# Patient Record
Sex: Female | Born: 1945 | Race: White | Hispanic: No | Marital: Married | Smoking: Never smoker
Health system: Southern US, Community
[De-identification: ages and names within clinical notes are randomized; demographics above are authoritative.]

## PROBLEM LIST (undated history)

## (undated) DIAGNOSIS — E079 Disorder of thyroid, unspecified: Secondary | ICD-10-CM

## (undated) DIAGNOSIS — I509 Heart failure, unspecified: Secondary | ICD-10-CM

## (undated) DIAGNOSIS — R9409 Abnormal results of other function studies of central nervous system: Secondary | ICD-10-CM

## (undated) DIAGNOSIS — I219 Acute myocardial infarction, unspecified: Secondary | ICD-10-CM

---

## 2012-12-12 ENCOUNTER — Emergency Department (HOSPITAL_COMMUNITY)
Admission: EM | Admit: 2012-12-12 | Discharge: 2012-12-12 | Disposition: A | Discharge status: Home / Self Care | Attending: Emergency Medicine | Admitting: Emergency Medicine

## 2012-12-12 ENCOUNTER — Encounter (HOSPITAL_COMMUNITY): Payer: Self-pay | Admitting: Emergency Medicine

## 2012-12-12 ENCOUNTER — Emergency Department (HOSPITAL_COMMUNITY)

## 2012-12-12 DIAGNOSIS — I252 Old myocardial infarction: Secondary | ICD-10-CM | POA: Insufficient documentation

## 2012-12-12 DIAGNOSIS — Z7982 Long term (current) use of aspirin: Secondary | ICD-10-CM | POA: Insufficient documentation

## 2012-12-12 DIAGNOSIS — Z862 Personal history of diseases of the blood and blood-forming organs and certain disorders involving the immune mechanism: Secondary | ICD-10-CM | POA: Insufficient documentation

## 2012-12-12 DIAGNOSIS — M79609 Pain in unspecified limb: Secondary | ICD-10-CM | POA: Insufficient documentation

## 2012-12-12 DIAGNOSIS — R209 Unspecified disturbances of skin sensation: Secondary | ICD-10-CM | POA: Insufficient documentation

## 2012-12-12 DIAGNOSIS — M79602 Pain in left arm: Secondary | ICD-10-CM

## 2012-12-12 DIAGNOSIS — Z79899 Other long term (current) drug therapy: Secondary | ICD-10-CM | POA: Insufficient documentation

## 2012-12-12 DIAGNOSIS — Z8639 Personal history of other endocrine, nutritional and metabolic disease: Secondary | ICD-10-CM | POA: Insufficient documentation

## 2012-12-12 DIAGNOSIS — I509 Heart failure, unspecified: Secondary | ICD-10-CM | POA: Insufficient documentation

## 2012-12-12 HISTORY — DX: Acute myocardial infarction, unspecified: I21.9

## 2012-12-12 HISTORY — DX: Disorder of thyroid, unspecified: E07.9

## 2012-12-12 HISTORY — DX: Heart failure, unspecified: I50.9

## 2012-12-12 MED ORDER — HYDROCODONE-ACETAMINOPHEN 5-325 MG PO TABS: 1.0000 | ORAL_TABLET | Freq: Once | ORAL | Status: DC

## 2012-12-12 MED ORDER — IBUPROFEN 200 MG PO TABS
600.0000 mg | ORAL_TABLET | Freq: Once | ORAL | Status: AC
  Administered 2012-12-12: 600 mg via ORAL
  Filled 2012-12-12: qty 3

## 2012-12-12 MED ORDER — MELOXICAM 15 MG PO TABS: 15.0000 mg | ORAL_TABLET | Freq: Every day | ORAL | Status: DC

## 2012-12-12 NOTE — ED Notes (Signed)
Pt c/o lt arm pain x several weeks.  States that now her lt ear is numb.  Pt will not get off the phone for her to talk to me.

## 2012-12-12 NOTE — ED Provider Notes (Signed)
CSN: [id number]     Arrival date & time 12/12/12  1208 History     First MD Initiated Contact with Patient 12/12/12 1255     Chief Complaint  Patient presents with  . Arm Pain   (Consider location/radiation/quality/duration/timing/severity/associated sxs/prior Treatment) HPI  Eileen Burke is a 67 y.o.female with a significant PMH of hx of CHF, thyroid disease and MI presents to the ER I her primary care doctor at [facility] who she was supposed to have an appointment with today for 1.5 weeks of left arm pain and onset today of left ear and neck numbness. Patient says that her left arm has been very painful to the touch and with range of motion and was due to see her primary care doctor today but when she called to speak with him before the appointment he told her to come to the ER instead. She said that a couple hours ago she developed this left side a year and facial numbness that has since mostly resolved. She admits that she felt weird. She has not had any aphasia, dysphasia, difficulty with her balance, headache or pain, nausea, vomiting, diarrhea, generalized or focal weakness. She has a history of CHF but has not had any lower extremity swelling, chest pain or wheezing.   Past Medical History  Diagnosis Date  . Thyroid disease   . CHF (congestive heart failure)   . MI (myocardial infarction)    History reviewed. No pertinent past surgical history. History reviewed. No pertinent family history. History  Substance Use Topics  . Smoking status: Never Smoker   . Smokeless tobacco: Not on file  . Alcohol Use: No   OB History   Grav Para Term Preterm Abortions TAB SAB Ect Mult Living                 Review of Systems ROS is negative unless otherwise stated in the HPI  Allergies  Codeine  Home Medications   Current Outpatient Rx  Name  Route  Sig  Dispense  Refill  . aspirin 325 MG tablet   Oral   Take 325 mg by mouth daily.         . calcium carbonate (OS-CAL) 600  MG TABS tablet   Oral   Take 600 mg by mouth 2 (two) times daily with a meal.         . cholecalciferol (VITAMIN D) 1000 UNITS tablet   Oral   Take 1,000 Units by mouth daily.         . naproxen sodium (ANAPROX) 220 MG tablet   Oral   Take 220 mg by mouth 2 (two) times daily with a meal.         . meloxicam (MOBIC) 15 MG tablet   Oral   Take 1 tablet (15 mg total) by mouth daily.   14 tablet   0    BP 108/63  Pulse 81  Temp(Src) 98.2 F (36.8 C) (Oral)  Resp 16  SpO2 98% Physical Exam  Nursing note and vitals reviewed. Constitutional: She is oriented to person, place, and time. She appears well-developed and well-nourished. No distress.  HENT:  Head: Normocephalic and atraumatic.   HEENT: Anicteric.  No pallor.  No discharge from ears, eyes, nose, or mouth.   Eyes: Pupils are equal, round, and reactive to light.  Neck: Normal range of motion. Neck supple.  Cardiovascular: Normal rate and regular rhythm.   Pulmonary/Chest: Effort normal.  Abdominal: Soft.  Musculoskeletal:  Left shoulder: She exhibits decreased range of motion, tenderness, bony tenderness and pain. She exhibits no swelling, no effusion, no crepitus, no deformity, no laceration, no spasm, normal pulse and normal strength.       Arms: Patient's arms are symmetrical no noticeable swelling of the left side compared to the right. Patient's hand has normal color and is warm no decreased range of motion. Capillary refill is less than 3 seconds. Patient can move her left arm although is painful to do so.   Neurological: She is alert and oriented to person, place, and time. She has normal strength. No cranial nerve deficit or sensory deficit. She displays a negative Romberg sign.  Skin: Skin is warm and dry.    ED Course   Procedures (including critical care time)  Labs Reviewed - No data to display Dg Chest 2 View  12/12/2012   *RADIOLOGY REPORT*  Clinical Data: Left shoulder and arm pain  progressively worse over 2 weeks, some numbness in left side of head and neck, history CHF, asthma  CHEST - 2 VIEW  Comparison: None.  Findings: Normal heart size, mediastinal contours, and pulmonary vascularity. Lungs clear. No pleural effusion or pneumothorax. Bones unremarkable.  IMPRESSION: No acute abnormalities.   Original Report Authenticated By: Ulyses Southward, M.D.   Dg Shoulder Left  12/12/2012   *RADIOLOGY REPORT*  Clinical Data: Left shoulder and arm pain progressively worse over 2 weeks  LEFT SHOULDER - 2+ VIEW  Comparison: None  Findings: Osseous mineralization normal. AC joint alignment normal. No acute fracture, dislocation, or bone destruction. Visualized left ribs intact.  IMPRESSION: No acute osseous abnormalities.   Original Report Authenticated By: Ulyses Southward, M.D.   1. Arm pain, left     MDM  Patient's pain appears to be musculoskeletal. No acute abnormalities to left shoulder or chest x-ray. Does not cardiac or pulmonary. They sound nerve related. She declined any muscle relaxers or strong pain medicine for this problem and would only like ibuprofen. Her ear exam did not show any abnormalities and she has a sensation of some tingling on the left side of her face but can appreciate my touch. She does not appear to be in any distress. Discussed case with Dr. Patria Mane before dc.  Pt upset that she was placed in the hallway and informs me that she has insurance and feels that she should have been given a normal bed. She wants to leave now since her xrays are normal. Will give a referral to ortho and ask that she reschedule her appointment with her PCP.  Rx: mobic  67 y.o.Eileen Burke evaluation in the Emergency Department is complete. It has been determined that no acute conditions requiring further emergency intervention are present at this time. The patient/guardian have been advised of the diagnosis and plan. We have discussed signs and symptoms that warrant return to the ED, such  as changes or worsening in symptoms.  Vital signs are stable at discharge. Filed Vitals:   12/12/12 1237  BP: 108/63  Pulse: 81  Temp: 98.2 F (36.8 C)  Resp: 16    Patient/guardian has voiced understanding and agreed to follow-up with the PCP or specialist.   Dorthula Matas, PA-C 12/12/12 1506

## 2012-12-12 NOTE — ED Provider Notes (Signed)
Medical screening examination/treatment/procedure(s) were performed by non-physician practitioner and as supervising physician I was immediately available for consultation/collaboration.  Lluvia Gwynne M Orvella Digiulio, MD 12/12/12 1507 

## 2014-02-14 ENCOUNTER — Emergency Department (HOSPITAL_COMMUNITY): Payer: Medicare Other

## 2014-02-14 ENCOUNTER — Encounter (HOSPITAL_COMMUNITY): Payer: Self-pay | Admitting: Emergency Medicine

## 2014-02-14 ENCOUNTER — Emergency Department (HOSPITAL_COMMUNITY)
Admission: EM | Admit: 2014-02-14 | Discharge: 2014-02-15 | Disposition: A | Discharge status: Home / Self Care | Payer: Medicare Other | Attending: Emergency Medicine | Admitting: Emergency Medicine

## 2014-02-14 DIAGNOSIS — Z79899 Other long term (current) drug therapy: Secondary | ICD-10-CM | POA: Insufficient documentation

## 2014-02-14 DIAGNOSIS — I252 Old myocardial infarction: Secondary | ICD-10-CM | POA: Insufficient documentation

## 2014-02-14 DIAGNOSIS — K8070 Calculus of gallbladder and bile duct without cholecystitis without obstruction: Secondary | ICD-10-CM | POA: Diagnosis not present

## 2014-02-14 DIAGNOSIS — Z8639 Personal history of other endocrine, nutritional and metabolic disease: Secondary | ICD-10-CM | POA: Diagnosis not present

## 2014-02-14 DIAGNOSIS — R1011 Right upper quadrant pain: Secondary | ICD-10-CM

## 2014-02-14 DIAGNOSIS — Z7982 Long term (current) use of aspirin: Secondary | ICD-10-CM | POA: Insufficient documentation

## 2014-02-14 DIAGNOSIS — Z791 Long term (current) use of non-steroidal anti-inflammatories (NSAID): Secondary | ICD-10-CM | POA: Insufficient documentation

## 2014-02-14 DIAGNOSIS — I509 Heart failure, unspecified: Secondary | ICD-10-CM | POA: Insufficient documentation

## 2014-02-14 HISTORY — DX: Abnormal results of other function studies of central nervous system: R94.09

## 2014-02-14 LAB — CBC WITH DIFFERENTIAL/PLATELET
BASOS ABS: 0 10*3/uL (ref 0.0–0.1)
Basophils Relative: 0 % (ref 0–1)
EOS ABS: 0.1 10*3/uL (ref 0.0–0.7)
EOS PCT: 2 % (ref 0–5)
HCT: 39.6 % (ref 36.0–46.0)
Hemoglobin: 13.5 g/dL (ref 12.0–15.0)
Lymphocytes Relative: 38 % (ref 12–46)
Lymphs Abs: 2.7 10*3/uL (ref 0.7–4.0)
MCH: 28.7 pg (ref 26.0–34.0)
MCHC: 34.1 g/dL (ref 30.0–36.0)
MCV: 84.1 fL (ref 78.0–100.0)
Monocytes Absolute: 0.5 10*3/uL (ref 0.1–1.0)
Monocytes Relative: 7 % (ref 3–12)
Neutro Abs: 3.9 10*3/uL (ref 1.7–7.7)
Neutrophils Relative %: 53 % (ref 43–77)
PLATELETS: 292 10*3/uL (ref 150–400)
RBC: 4.71 MIL/uL (ref 3.87–5.11)
RDW: 13.3 % (ref 11.5–15.5)
WBC: 7.3 10*3/uL (ref 4.0–10.5)

## 2014-02-14 LAB — COMPREHENSIVE METABOLIC PANEL
ALT: 15 U/L (ref 0–35)
AST: 22 U/L (ref 0–37)
Albumin: 3.7 g/dL (ref 3.5–5.2)
Alkaline Phosphatase: 81 U/L (ref 39–117)
Anion gap: 13 (ref 5–15)
BILIRUBIN TOTAL: 0.3 mg/dL (ref 0.3–1.2)
BUN: 9 mg/dL (ref 6–23)
CHLORIDE: 102 meq/L (ref 96–112)
CO2: 28 meq/L (ref 19–32)
CREATININE: 0.91 mg/dL (ref 0.50–1.10)
Calcium: 9.2 mg/dL (ref 8.4–10.5)
GFR calc Af Amer: 75 mL/min — ABNORMAL LOW (ref >90)
GFR, EST NON AFRICAN AMERICAN: 64 mL/min — AB (ref >90)
GLUCOSE: 167 mg/dL — AB (ref 70–99)
Potassium: 3.6 mEq/L — ABNORMAL LOW (ref 3.7–5.3)
Sodium: 143 mEq/L (ref 137–147)
Total Protein: 7.3 g/dL (ref 6.0–8.3)

## 2014-02-14 LAB — D-DIMER, QUANTITATIVE: D-Dimer, Quant: 0.5 ug/mL-FEU — ABNORMAL HIGH (ref 0.00–0.48)

## 2014-02-14 LAB — I-STAT TROPONIN, ED: Troponin i, poc: 0 ng/mL (ref 0.00–0.08)

## 2014-02-14 LAB — LIPASE, BLOOD: LIPASE: 26 U/L (ref 11–59)

## 2014-02-14 MED ORDER — SODIUM CHLORIDE 0.9 % IV BOLUS (SEPSIS)
500.0000 mL | Freq: Once | INTRAVENOUS | Status: AC
  Administered 2014-02-14: 500 mL via INTRAVENOUS

## 2014-02-14 MED ORDER — FENTANYL CITRATE 0.05 MG/ML IJ SOLN: 50.0000 ug | Freq: Once | INTRAMUSCULAR | Status: DC

## 2014-02-14 MED ORDER — ONDANSETRON HCL 4 MG/2ML IJ SOLN: 4.0000 mg | Freq: Once | INTRAMUSCULAR | Status: DC

## 2014-02-14 NOTE — ED Notes (Signed)
Pt c/o RUQ pain, worsening today. RUQ tender on palpation. Denies NV, has had episodes of diarrhea. Describes pain as intermittent sharp shooting pain. Denies urinary issues. Pt reports pain increases after eating.

## 2014-02-14 NOTE — ED Notes (Signed)
Presents with ruq abdominal pain began this AM when awoke became worse throughour the day. Pt states she has been unable to eat  For one month. Pt is requesting ice water and moaning in pain.  She states the pain is worse when she bends over.

## 2014-02-15 DIAGNOSIS — K8070 Calculus of gallbladder and bile duct without cholecystitis without obstruction: Secondary | ICD-10-CM | POA: Diagnosis not present

## 2014-02-15 MED ORDER — TRAMADOL HCL 50 MG PO TABS: 50.0000 mg | ORAL_TABLET | Freq: Four times a day (QID) | ORAL | Status: AC | PRN

## 2014-02-15 MED ORDER — HYDROCODONE-ACETAMINOPHEN 5-325 MG PO TABS: 1.0000 | ORAL_TABLET | Freq: Four times a day (QID) | ORAL | Status: AC | PRN

## 2014-02-15 MED ORDER — TRAMADOL HCL 50 MG PO TABS
50.0000 mg | ORAL_TABLET | Freq: Once | ORAL | Status: AC
  Administered 2014-02-15: 50 mg via ORAL
  Filled 2014-02-15: qty 1

## 2014-02-15 NOTE — Discharge Instructions (Signed)
The results here show that you have gall stones. The pain is not typical of gall bladders, but yourp ain might be from gall stones none the less, as you hada negative CT scan recently of the chest. See your GI doctor as planned. See the Surgeons as requested.  Please return to the ER if your symptoms worsen; you have increased pain, fevers, chills, inability to keep any medications down, confusion. Otherwise see the outpatient doctor as requested.   Biliary Colic  Biliary colic is a steady or irregular pain in the upper abdomen. It is usually under the right side of the rib cage. It happens when gallstones interfere with the normal flow of bile from the gallbladder. Bile is a liquid that helps to digest fats. Bile is made in the liver and stored in the gallbladder. When you eat a meal, bile passes from the gallbladder through the cystic duct and the common bile duct into the small intestine. There, it mixes with partially digested food. If a gallstone blocks either of these ducts, the normal flow of bile is blocked. The muscle cells in the bile duct contract forcefully to try to move the stone. This causes the pain of biliary colic.  SYMPTOMS   A person with biliary colic usually complains of pain in the upper abdomen. This pain can be:  In the center of the upper abdomen just below the breastbone.  In the upper-right part of the abdomen, near the gallbladder and liver.  Spread back toward the right shoulder blade.  Nausea and vomiting.  The pain usually occurs after eating.  Biliary colic is usually triggered by the digestive system's demand for bile. The demand for bile is high after fatty meals. Symptoms can also occur when a person who has been fasting suddenly eats a very large meal. Most episodes of biliary colic pass after 1 to 5 hours. After the most intense pain passes, your abdomen may continue to ache mildly for about 24 hours. DIAGNOSIS  After you describe your symptoms, your  caregiver will perform a physical exam. He or she will pay attention to the upper right portion of your belly (abdomen). This is the area of your liver and gallbladder. An ultrasound will help your caregiver look for gallstones. Specialized scans of the gallbladder may also be done. Blood tests may be done, especially if you have fever or if your pain persists. PREVENTION  Biliary colic can be prevented by controlling the risk factors for gallstones. Some of these risk factors, such as heredity, increasing age, and pregnancy are a normal part of life. Obesity and a high-fat diet are risk factors you can change through a healthy lifestyle. Women going through menopause who take hormone replacement therapy (estrogen) are also more likely to develop biliary colic. TREATMENT   Pain medication may be prescribed.  You may be encouraged to eat a fat-free diet.  If the first episode of biliary colic is severe, or episodes of colic keep retuning, surgery to remove the gallbladder (cholecystectomy) is usually recommended. This procedure can be done through small incisions using an instrument called a laparoscope. The procedure often requires a brief stay in the hospital. Some people can leave the hospital the same day. It is the most widely used treatment in people troubled by painful gallstones. It is effective and safe, with no complications in more than 90% of cases.  If surgery cannot be done, medication that dissolves gallstones may be used. This medication is expensive and can take  months or years to work. Only small stones will dissolve.  Rarely, medication to dissolve gallstones is combined with a procedure called shock-wave lithotripsy. This procedure uses carefully aimed shock waves to break up gallstones. In many people treated with this procedure, gallstones form again within a few years. PROGNOSIS  If gallstones block your cystic duct or common bile duct, you are at risk for repeated episodes of  biliary colic. There is also a 25% chance that you will develop a gallbladder infection(acute cholecystitis), or some other complication of gallstones within 10 to 20 years. If you have surgery, schedule it at a time that is convenient for you and at a time when you are not sick. HOME CARE INSTRUCTIONS   Drink plenty of clear fluids.  Avoid fatty, greasy or fried foods, or any foods that make your pain worse.  Take medications as directed. SEEK MEDICAL CARE IF:   You develop a fever over 100.5 F (38.1 C).  Your pain gets worse over time.  You develop nausea that prevents you from eating and drinking.  You develop vomiting. SEEK IMMEDIATE MEDICAL CARE IF:   You have continuous or severe belly (abdominal) pain which is not relieved with medications.  You develop nausea and vomiting which is not relieved with medications.  You have symptoms of biliary colic and you suddenly develop a fever and shaking chills. This may signal cholecystitis. Call your caregiver immediately.  You develop a yellow color to your skin or the white part of your eyes (jaundice). Document Released: 09/12/2005 Document Revised: 07/04/2011 Document Reviewed: 11/22/2007 [facility] Patient Information 2015 [place], [place]. This information is not intended to replace advice given to you by your health care provider. Make sure you discuss any questions you have with your health care provider.  Abdominal Pain Many things can cause belly (abdominal) pain. Most times, the belly pain is not dangerous. Many cases of belly pain can be watched and treated at home. HOME CARE   Do not take medicines that help you go poop (laxatives) unless told to by your doctor.  Only take medicine as told by your doctor.  Eat or drink as told by your doctor. Your doctor will tell you if you should be on a special diet. GET HELP IF:  You do not know what is causing your belly pain.  You have belly pain while you are sick to your  stomach (nauseous) or have runny poop (diarrhea).  You have pain while you pee or poop.  Your belly pain wakes you up at night.  You have belly pain that gets worse or better when you eat.  You have belly pain that gets worse when you eat fatty foods.  You have a fever. GET HELP RIGHT AWAY IF:   The pain does not go away within 2 hours.  You keep throwing up (vomiting).  The pain changes and is only in the right or left part of the belly.  You have bloody or tarry looking poop. MAKE SURE YOU:   Understand these instructions.  Will watch your condition.  Will get help right away if you are not doing well or get worse. Document Released: 09/28/2007 Document Revised: 04/16/2013 Document Reviewed: 12/19/2012 [facility] Patient Information 2015 [place], [place]. This information is not intended to replace advice given to you by your health care provider. Make sure you discuss any questions you have with your health care provider.

## 2014-02-15 NOTE — ED Notes (Signed)
Dr. Nanavati at bedside 

## 2014-02-15 NOTE — ED Provider Notes (Signed)
CSN: [id number]     Arrival date & time 02/14/14  1832 History   First MD Initiated Contact with Patient 02/14/14 2059     Chief Complaint  Patient presents with  . Abdominal Pain     (Consider location/radiation/quality/duration/timing/severity/associated sxs/prior Treatment) HPI Comments: Pt comes in with cc of RUQ pain. Pain x 2 months, intermittent, located in the right side, under her breast and moving posteriorly. Pain today more constant and severe. Pain is sharp, not colicky. There is no nausea, although, pt does have anorexia x 2 months. There is no fevers, chills, UTI like sx., cough. [facility] and was informed that it was negative for any malignancy, or significant pathology. No hx pf cancer. Pt has no cough, and pain is not pleuritic.   Patient is a 68 y.o. female presenting with abdominal pain. The history is provided by the patient.  Abdominal Pain Associated symptoms: no chest pain, no dysuria, no nausea, no shortness of breath and no vomiting     Past Medical History  Diagnosis Date  . Thyroid disease   . CHF (congestive heart failure)   . MI (myocardial infarction)   . Other abnormality of brain or central nervous system function study     spots on lungs   History reviewed. No pertinent past surgical history. History reviewed. No pertinent family history. History  Substance Use Topics  . Smoking status: Never Smoker   . Smokeless tobacco: Not on file  . Alcohol Use: No   OB History   Grav Para Term Preterm Abortions TAB SAB Ect Mult Living                 Review of Systems  Constitutional: Positive for activity change.  Respiratory: Negative for shortness of breath.   Cardiovascular: Negative for chest pain.  Gastrointestinal: Positive for abdominal pain. Negative for nausea and vomiting.  Genitourinary: Negative for dysuria.  Musculoskeletal: Negative for neck pain.  Neurological: Negative for headaches.       Allergies  Codeine  Home Medications   Prior to Admission medications   Medication Sig Start Date End Date Taking? Authorizing Provider  aspirin 325 MG tablet Take 325 mg by mouth daily.   Yes Historical Provider, MD  calcium carbonate (OS-CAL) 600 MG TABS tablet Take 600 mg by mouth 2 (two) times daily with a meal.   Yes Historical Provider, MD  cholecalciferol (VITAMIN D) 1000 UNITS tablet Take 1,000 Units by mouth daily.   Yes Historical Provider, MD  naproxen sodium (ANAPROX) 220 MG tablet Take 220 mg by mouth 2 (two) times daily with a meal.   Yes Historical Provider, MD  HYDROcodone-acetaminophen (NORCO/VICODIN) 5-325 MG per tablet Take 1 tablet by mouth every 6 (six) hours as needed. 02/15/14   Derwood KaplanAnkit Shacoria Latif, MD  traMADol (ULTRAM) 50 MG tablet Take 1 tablet (50 mg total) by mouth every 6 (six) hours as needed. 02/15/14   Cassidy Tashiro, MD   BP 108/58  Pulse 102  Temp(Src) 97.4 F (36.3 C) (Oral)  Resp 14  SpO2 96% Physical Exam  Nursing note and vitals reviewed. Constitutional: She is oriented to person, place, and time. She appears well-developed and well-nourished.  HENT:  Head: Normocephalic and atraumatic.  Eyes: EOM are normal. Pupils are equal, round, and reactive to light.  Neck: Neck supple.  Cardiovascular: Normal rate, regular rhythm and normal heart sounds.   No murmur heard. Pulmonary/Chest: Effort normal. No respiratory distress.  Abdominal: Soft. She exhibits no distension. There is tenderness. There is no rebound and no guarding.  Neg murphy's, but tenderness over the RUQ, under the breast, and in the flank region.  Neurological: She is alert and oriented to person, place, and time.  Skin: Skin is warm and dry.    ED Course  Procedures (including critical care time) Labs Review Labs Reviewed  COMPREHENSIVE METABOLIC PANEL - Abnormal; Notable for the following:    Potassium 3.6 (*)    Glucose, Bld 167 (*)    GFR calc non Af Amer 64 (*)     GFR calc Af Amer 75 (*)    All other components within normal limits  D-DIMER, QUANTITATIVE - Abnormal; Notable for the following:    D-Dimer, Quant 0.50 (*)    All other components within normal limits  CBC WITH DIFFERENTIAL  LIPASE, BLOOD  URINALYSIS, ROUTINE W REFLEX MICROSCOPIC  I-STAT TROPOININ, ED    Imaging Review [place] Abdomen Complete  02/14/2014   CLINICAL DATA:  Right upper quadrant pain.  EXAM: ULTRASOUND ABDOMEN COMPLETE  COMPARISON:  None.  FINDINGS: Gallbladder: Gallbladder is contracted. 5 mm mobile gallstone noted in the fundus. No wall thickening. Negative sonographic Murphy's.  Common bile duct: Diameter: Normal caliber, 4 mm  Liver: Increased, coarsened echotexture.  No focal abnormality.  IVC: No abnormality visualized.  Pancreas: Visualized portion unremarkable.  Spleen: Size and appearance within normal limits.  Right Kidney: Length: 10.0 cm. Echogenicity within normal limits. No mass or hydronephrosis visualized.  Left Kidney: Length: 10.9 cm. 1.6 cm midpole cyst which appears benign.  Abdominal aorta: No aneurysm visualized.  Other findings: None.  IMPRESSION: Contracted gallbladder with single 5 mm mobile stone noted. No evidence of cholecystitis.  Fatty liver.   Electronically Signed   By: Charlett NoseKevin  Dover M.D.   On: 02/14/2014 23:08     EKG Interpretation None      MDM   Final diagnoses:  RUQ abdominal pain  Calculus of gallbladder and bile duct without cholecystitis or obstruction    Pt comes in with right sided abd pain. Hx of gall stones - US ordered, and there is no cholecystitis. The pain is atypical for gall stones. PCP has been evaluating this pain, and pt is to see GI soon.  Pt has no hx of PE, DVT, and no risk factors for the same. Dimer ordered, and with age adjusted cutoff, she is negative study, and doesn't need a CT scan. [facility] and [facility] - although, that appears to be for surveillance of lesion in her  lung.  Discussed findings with the patient. Certainly dont see any reason for CT imaging, but informed her that her pain could be atypical pain from gall bladder, and it would be a good idea to see Surgeons for their assessment.  PT agrees. Will give pain meds. Return precautions discussed, and informed her of my availability the next 2 days. Will d.c.  Derwood KaplanAnkit Harshitha Fretz, MD 02/15/14 [id number]

## 2014-08-01 ENCOUNTER — Ambulatory Visit: Payer: Medicare Other | Admitting: Obstetrics and Gynecology

## 2014-08-04 ENCOUNTER — Ambulatory Visit: Payer: Medicare Other | Admitting: Obstetrics & Gynecology

## 2016-03-22 IMAGING — US US ABDOMEN COMPLETE
1 series · 14 of 25 positions shown · non-contrast
Comparison: None.

CLINICAL DATA: Right upper quadrant pain.

EXAM:
ULTRASOUND ABDOMEN COMPLETE

[Series 1: us abdomen complete · 0.26mm/px · 14 of 47 slices shown]
[im 1/47]
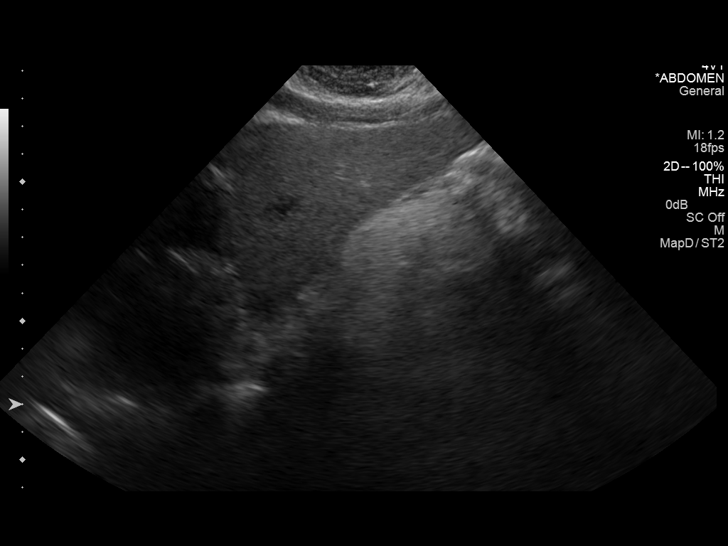
[im 4/47]
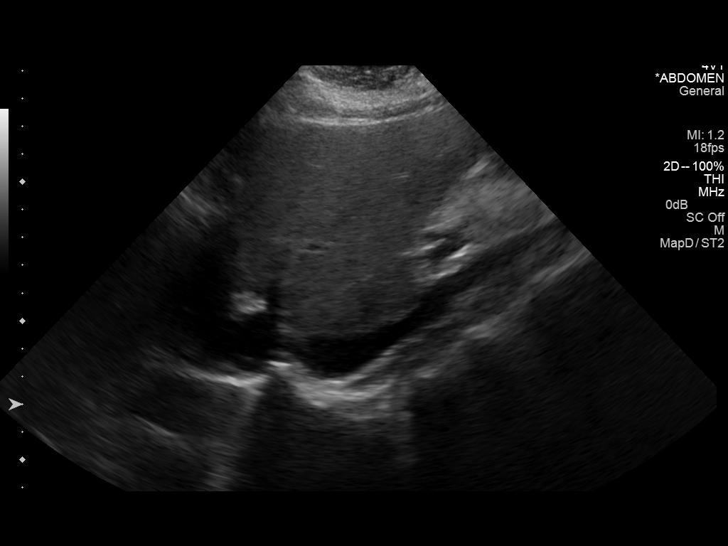
[im 8/47]
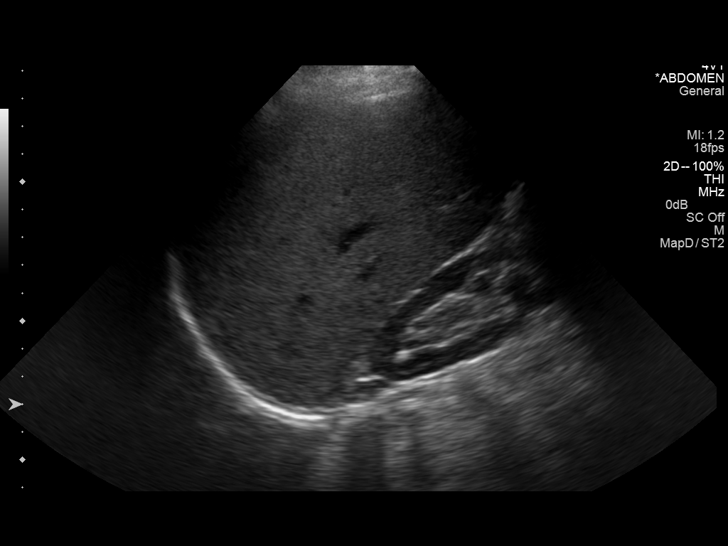
[im 12/47]
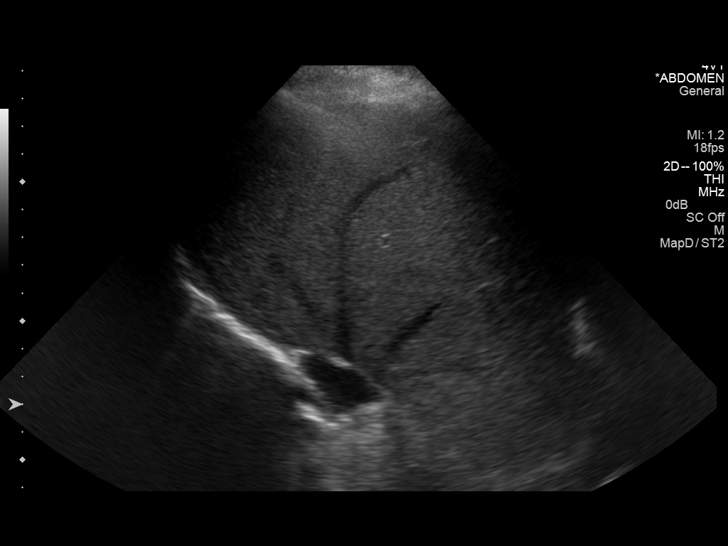
[im 16/47]
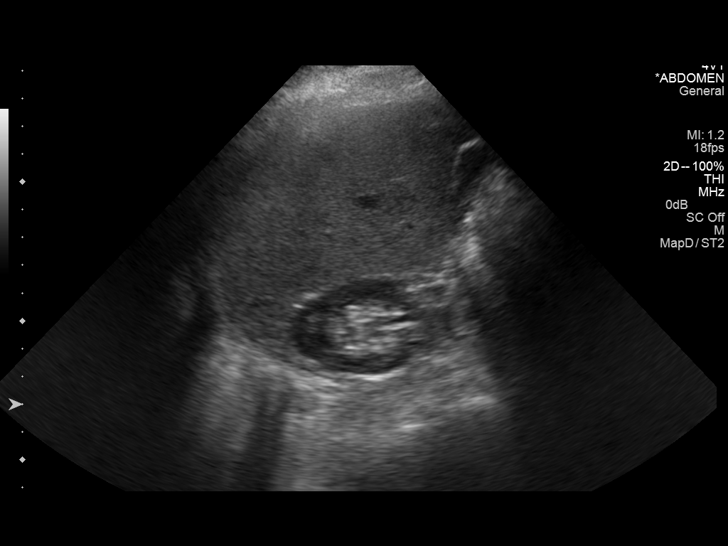
[im 18/47]
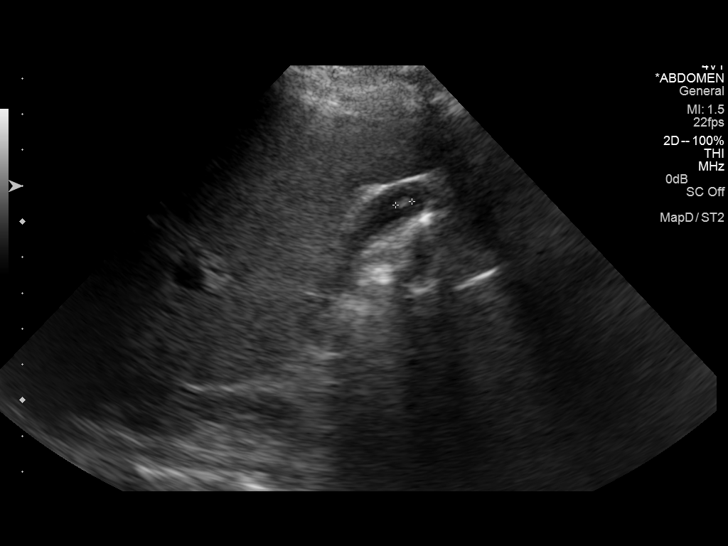
[im 22/47]
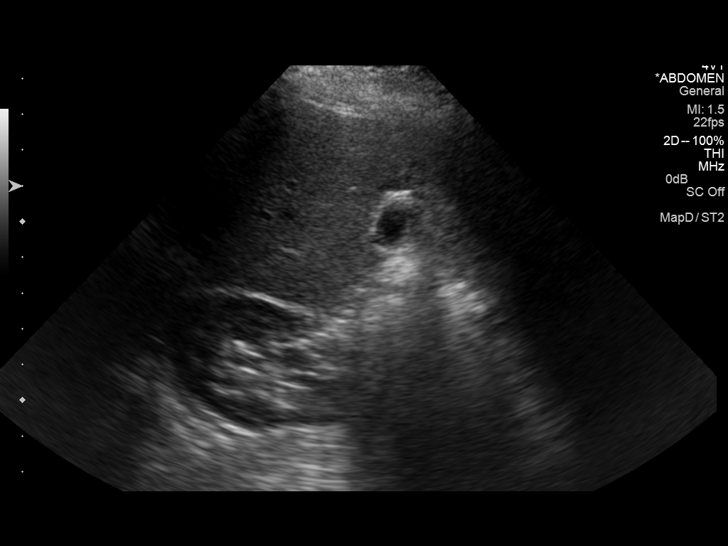
[im 25/47]
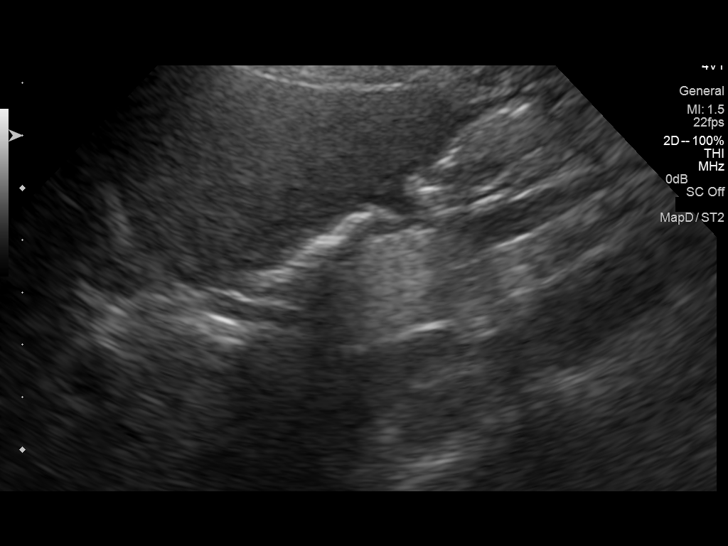
[im 29/47]
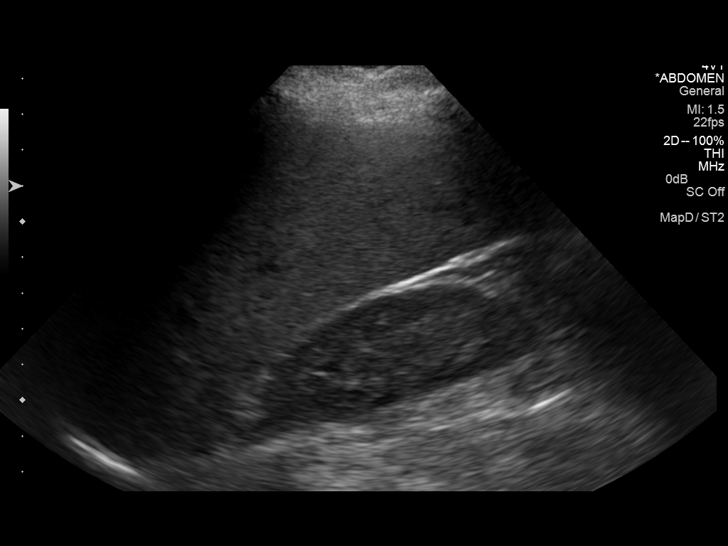
[im 31/47]
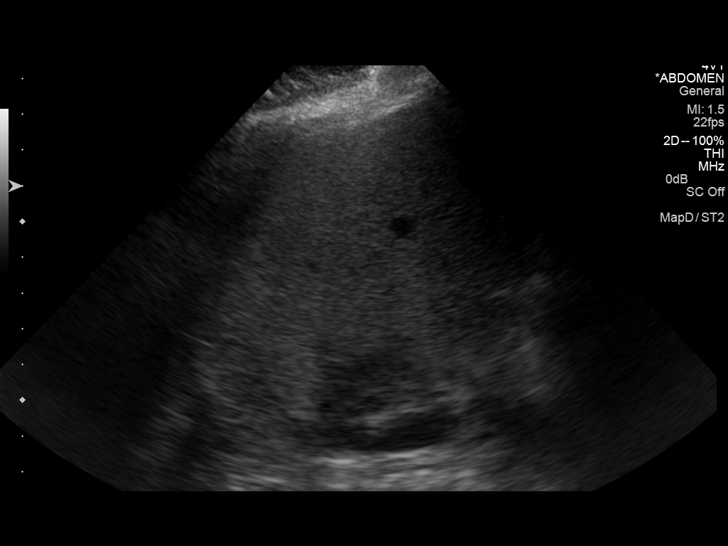
[im 35/47]
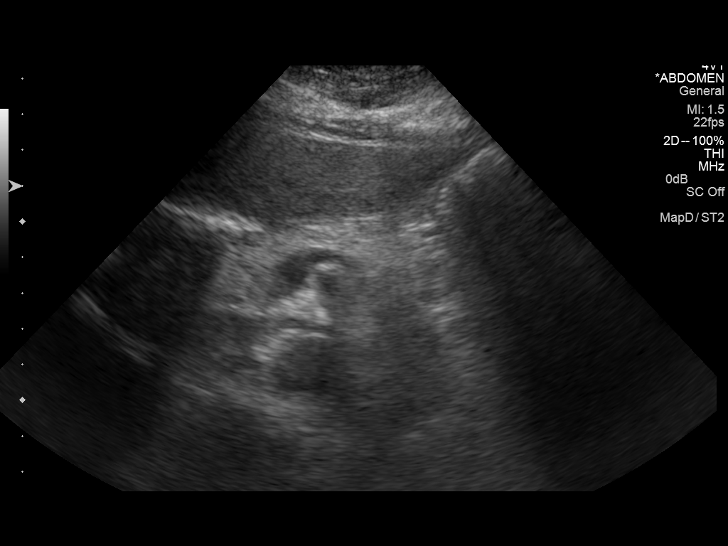
[im 39/47]
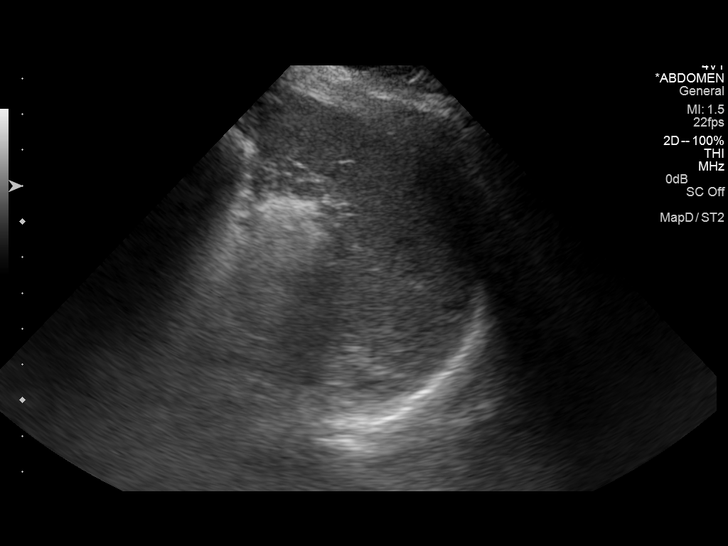
[im 43/47]
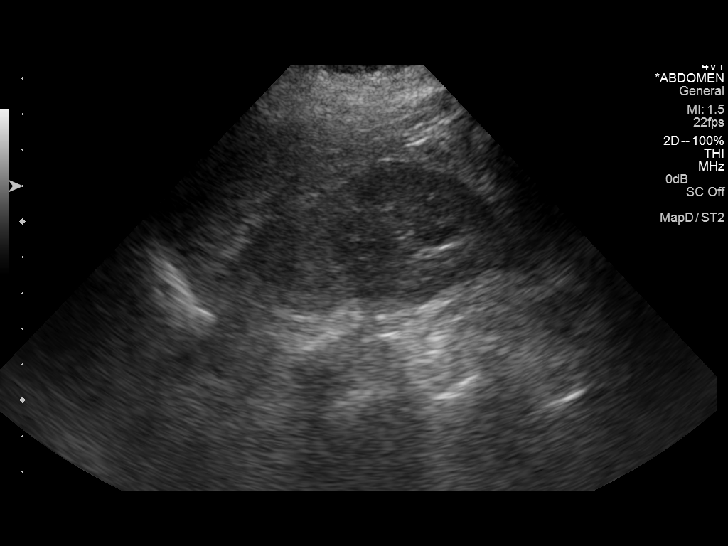
[im 47/47]
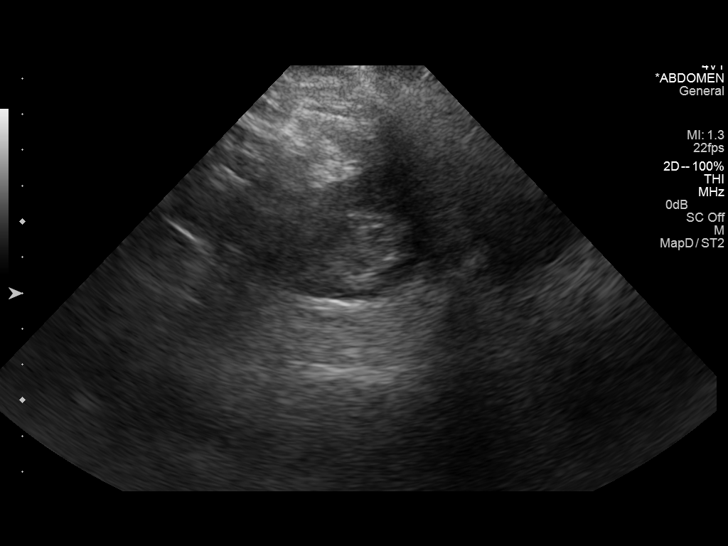

[14 of 25 positions shown; findings below may reference images not displayed]

FINDINGS: Gallbladder: Gallbladder is contracted. 5 mm mobile gallstone noted
in the fundus. No wall thickening. Negative sonographic Chauhan.

Common bile duct: Diameter: Normal caliber, 4 mm

Liver: Increased, coarsened echotexture.  No focal abnormality.

IVC: No abnormality visualized.

Pancreas: Visualized portion unremarkable.

Spleen: Size and appearance within normal limits.

Right Kidney: Length: 10.0 cm. Echogenicity within normal limits. No
mass or hydronephrosis visualized.

Left Kidney: Length: 10.9 cm. 1.6 cm midpole cyst which appears
benign.

Abdominal aorta: No aneurysm visualized.

Other findings: None.
IMPRESSION: Contracted gallbladder with single 5 mm mobile stone noted. No
evidence of cholecystitis.

Fatty liver.
